# Patient Record
Sex: Female | Born: 2005 | Race: Black or African American | Hispanic: No | Marital: Single | Smoking: Never smoker
Health system: Southern US, Community
[De-identification: ages and names within clinical notes are randomized; demographics above are authoritative.]

---
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
## 2023-05-04 DIAGNOSIS — G44209 Tension-type headache, unspecified, not intractable: Secondary | ICD-10-CM | POA: Diagnosis not present

## 2023-05-04 DIAGNOSIS — F32A Depression, unspecified: Secondary | ICD-10-CM | POA: Diagnosis not present

## 2023-05-04 DIAGNOSIS — Z113 Encounter for screening for infections with a predominantly sexual mode of transmission: Secondary | ICD-10-CM | POA: Diagnosis not present

## 2023-05-04 DIAGNOSIS — Z68.41 Body mass index (BMI) pediatric, greater than or equal to 95th percentile for age: Secondary | ICD-10-CM | POA: Diagnosis not present

## 2023-05-04 DIAGNOSIS — Z23 Encounter for immunization: Secondary | ICD-10-CM | POA: Diagnosis not present

## 2023-05-04 DIAGNOSIS — R000 Tachycardia, unspecified: Secondary | ICD-10-CM | POA: Diagnosis not present

## 2023-05-04 DIAGNOSIS — Z7182 Exercise counseling: Secondary | ICD-10-CM | POA: Diagnosis not present

## 2023-05-04 DIAGNOSIS — Z713 Dietary counseling and surveillance: Secondary | ICD-10-CM | POA: Diagnosis not present

## 2023-05-04 DIAGNOSIS — I341 Nonrheumatic mitral (valve) prolapse: Secondary | ICD-10-CM | POA: Diagnosis not present

## 2023-05-04 DIAGNOSIS — F419 Anxiety disorder, unspecified: Secondary | ICD-10-CM | POA: Diagnosis not present

## 2023-05-04 DIAGNOSIS — Z0001 Encounter for general adult medical examination with abnormal findings: Secondary | ICD-10-CM | POA: Diagnosis not present

## 2023-05-23 DIAGNOSIS — Z713 Dietary counseling and surveillance: Secondary | ICD-10-CM | POA: Diagnosis not present

## 2023-05-23 DIAGNOSIS — Z68.41 Body mass index (BMI) pediatric, greater than or equal to 95th percentile for age: Secondary | ICD-10-CM | POA: Diagnosis not present

## 2023-06-01 DIAGNOSIS — E559 Vitamin D deficiency, unspecified: Secondary | ICD-10-CM | POA: Diagnosis not present

## 2023-06-01 DIAGNOSIS — G44209 Tension-type headache, unspecified, not intractable: Secondary | ICD-10-CM | POA: Diagnosis not present

## 2023-06-01 DIAGNOSIS — Z0000 Encounter for general adult medical examination without abnormal findings: Secondary | ICD-10-CM | POA: Diagnosis not present

## 2023-06-01 DIAGNOSIS — Z713 Dietary counseling and surveillance: Secondary | ICD-10-CM | POA: Diagnosis not present

## 2023-06-01 DIAGNOSIS — F32A Depression, unspecified: Secondary | ICD-10-CM | POA: Diagnosis not present

## 2023-06-01 DIAGNOSIS — Z7182 Exercise counseling: Secondary | ICD-10-CM | POA: Diagnosis not present

## 2023-06-01 DIAGNOSIS — F419 Anxiety disorder, unspecified: Secondary | ICD-10-CM | POA: Diagnosis not present

## 2023-06-03 DIAGNOSIS — R000 Tachycardia, unspecified: Secondary | ICD-10-CM | POA: Diagnosis not present

## 2023-06-03 DIAGNOSIS — I341 Nonrheumatic mitral (valve) prolapse: Secondary | ICD-10-CM | POA: Diagnosis not present

## 2023-06-03 DIAGNOSIS — R079 Chest pain, unspecified: Secondary | ICD-10-CM | POA: Diagnosis not present

## 2023-06-03 DIAGNOSIS — F419 Anxiety disorder, unspecified: Secondary | ICD-10-CM | POA: Diagnosis not present

## 2023-06-04 DIAGNOSIS — I341 Nonrheumatic mitral (valve) prolapse: Secondary | ICD-10-CM | POA: Diagnosis not present

## 2023-06-04 DIAGNOSIS — R000 Tachycardia, unspecified: Secondary | ICD-10-CM | POA: Diagnosis not present

## 2023-06-24 DIAGNOSIS — F419 Anxiety disorder, unspecified: Secondary | ICD-10-CM | POA: Diagnosis not present

## 2023-06-24 DIAGNOSIS — F32A Depression, unspecified: Secondary | ICD-10-CM | POA: Diagnosis not present

## 2023-06-24 DIAGNOSIS — F902 Attention-deficit hyperactivity disorder, combined type: Secondary | ICD-10-CM | POA: Diagnosis not present

## 2023-06-30 DIAGNOSIS — Z7182 Exercise counseling: Secondary | ICD-10-CM | POA: Diagnosis not present

## 2023-06-30 DIAGNOSIS — F419 Anxiety disorder, unspecified: Secondary | ICD-10-CM | POA: Diagnosis not present

## 2023-06-30 DIAGNOSIS — F32A Depression, unspecified: Secondary | ICD-10-CM | POA: Diagnosis not present

## 2023-06-30 DIAGNOSIS — Z713 Dietary counseling and surveillance: Secondary | ICD-10-CM | POA: Diagnosis not present

## 2023-07-19 DIAGNOSIS — F419 Anxiety disorder, unspecified: Secondary | ICD-10-CM | POA: Diagnosis not present

## 2023-07-19 DIAGNOSIS — F32A Depression, unspecified: Secondary | ICD-10-CM | POA: Diagnosis not present

## 2023-07-19 DIAGNOSIS — F902 Attention-deficit hyperactivity disorder, combined type: Secondary | ICD-10-CM | POA: Diagnosis not present

## 2023-08-08 DIAGNOSIS — F32A Depression, unspecified: Secondary | ICD-10-CM | POA: Diagnosis not present

## 2023-08-08 DIAGNOSIS — Z713 Dietary counseling and surveillance: Secondary | ICD-10-CM | POA: Diagnosis not present

## 2023-08-08 DIAGNOSIS — Z7182 Exercise counseling: Secondary | ICD-10-CM | POA: Diagnosis not present

## 2023-10-31 ENCOUNTER — Emergency Department
Admission: EM | Admit: 2023-10-31 | Discharge: 2023-10-31 | Disposition: A | Discharge status: Home / Self Care | Attending: Emergency Medicine | Admitting: Emergency Medicine

## 2023-10-31 ENCOUNTER — Other Ambulatory Visit: Payer: Self-pay

## 2023-10-31 DIAGNOSIS — B9789 Other viral agents as the cause of diseases classified elsewhere: Secondary | ICD-10-CM | POA: Insufficient documentation

## 2023-10-31 DIAGNOSIS — J028 Acute pharyngitis due to other specified organisms: Secondary | ICD-10-CM | POA: Diagnosis not present

## 2023-10-31 DIAGNOSIS — J029 Acute pharyngitis, unspecified: Secondary | ICD-10-CM | POA: Insufficient documentation

## 2023-10-31 LAB — RESP PANEL BY RT-PCR (RSV, FLU A&B, COVID)  RVPGX2
Influenza A by PCR: NEGATIVE
Influenza B by PCR: NEGATIVE
Resp Syncytial Virus by PCR: NEGATIVE
SARS Coronavirus 2 by RT PCR: NEGATIVE

## 2023-10-31 LAB — GROUP A STREP BY PCR: Group A Strep by PCR: NOT DETECTED

## 2023-10-31 NOTE — Discharge Instructions (Addendum)
 You tested negative for strep, RSV, flu and COVID today.  You can take Tylenol and ibuprofen as needed for your sore throat.  Return to the emergency department with worsening symptoms.

## 2023-10-31 NOTE — ED Triage Notes (Signed)
 Pt c/o of sore throat, pt unsure sick contacts. NAD noted. Pt denies fevers or chills.

## 2023-10-31 NOTE — ED Provider Notes (Signed)
 [facility] Provider Note    Event Date/Time   First MD Initiated Contact with Patient 10/31/23 1515     (approximate)   History   Sore Throat   HPI  Caroline Hurst is a 18 y.o. female with no PMH who presents for evaluation of sore throat for 3 days.  Patient thinks she has strep as she had an exposure to it.  Patient states she needs to be evaluated before returning to work.  She does not think she has had a fever.  She also endorses some cough and congestion.      Physical Exam   Triage Vital Signs: ED Triage Vitals  Encounter Vitals Group     BP 10/31/23 1129 (!) 134/98     Systolic BP Percentile --      Diastolic BP Percentile --      Pulse Rate 10/31/23 1129 76     Resp 10/31/23 1129 18     Temp 10/31/23 1129 98.2 F (36.8 C)     Temp Source 10/31/23 1129 Oral     SpO2 10/31/23 1129 100 %     Weight 10/31/23 1127 162 lb 14.7 oz (73.9 kg)     Height 10/31/23 1127 5\' 5"  (1.651 m)     Head Circumference --      Peak Flow --      Pain Score --      Pain Loc --      Pain Education --      Exclude from Growth Chart --     Most recent vital signs: Vitals:   10/31/23 1129  BP: (!) 134/98  Pulse: 76  Resp: 18  Temp: 98.2 F (36.8 C)  SpO2: 100%   General: Awake, no distress.  CV:  Good peripheral perfusion.  RRR. Resp:  Normal effort.  CTAB. Abd:  No distention.  Other:  Oral mucous membranes are moist, tonsils are not enlarged but do have white exudate on them, pharynx is erythematous.   ED Results / Procedures / Treatments   Labs (all labs ordered are listed, but only abnormal results are displayed) Labs Reviewed  GROUP A STREP BY PCR  RESP PANEL BY RT-PCR (RSV, FLU A&B, COVID)  [id number]    PROCEDURES:  Critical Care performed: No  Procedures   MEDICATIONS ORDERED IN ED: Medications - No data to display   IMPRESSION / MDM / ASSESSMENT AND PLAN / ED COURSE  I reviewed the triage vital signs and the nursing  notes.                             18 year old female presents for evaluation of sore throat.  Vital signs are stable patient NAD on exam.  Differential diagnosis includes, but is not limited to, strep pharyngitis, viral pharyngitis, COVID, flu, RSV.  Patient's presentation is most consistent with acute, uncomplicated illness.  Respiratory panel and strep test are both negative.  Suspect this is a viral pharyngitis.  Recommended patient on symptomatic management using Tylenol, ibuprofen, throat sprays, cough drops, etc. she was given a note for work.  Patient voiced understanding, all questions were answered and she was stable at discharge.      FINAL CLINICAL IMPRESSION(S) / ED DIAGNOSES   Final diagnoses:  Viral pharyngitis     Rx / DC Orders   ED Discharge Orders     None        Note:  This  document was prepared using Conservation officer, historic buildings and may include unintentional dictation errors.   Cameron Ali, PA-C 10/31/23 1531    Jene Every, MD 10/31/23 [phone]

## 2023-10-31 NOTE — ED Notes (Signed)
 See triage note  Presents with sore throat    States sxs' started couple of days ago  Afebrile on arrival

## 2023-12-15 DIAGNOSIS — Z713 Dietary counseling and surveillance: Secondary | ICD-10-CM | POA: Diagnosis not present

## 2023-12-15 DIAGNOSIS — Z7182 Exercise counseling: Secondary | ICD-10-CM | POA: Diagnosis not present

## 2023-12-15 DIAGNOSIS — Z789 Other specified health status: Secondary | ICD-10-CM | POA: Diagnosis not present

## 2023-12-15 DIAGNOSIS — F32A Depression, unspecified: Secondary | ICD-10-CM | POA: Diagnosis not present

## 2023-12-15 DIAGNOSIS — F419 Anxiety disorder, unspecified: Secondary | ICD-10-CM | POA: Diagnosis not present

## 2023-12-29 DIAGNOSIS — H5203 Hypermetropia, bilateral: Secondary | ICD-10-CM | POA: Diagnosis not present

## 2023-12-30 DIAGNOSIS — H5213 Myopia, bilateral: Secondary | ICD-10-CM | POA: Diagnosis not present

## 2024-01-25 ENCOUNTER — Telehealth: Payer: Self-pay | Admitting: *Deleted

## 2024-01-25 DIAGNOSIS — Z7689 Persons encountering health services in other specified circumstances: Secondary | ICD-10-CM

## 2024-01-25 NOTE — Progress Notes (Signed)
 Complex Care Management Note  Care Guide Note 01/25/2024 Name: Caroline Hurst MRN: [id number] DOB: 05/29/2006  Caroline Hurst is a 18 y.o. year old female who sees Patient, No Pcp Per for primary care. I reached out to Vinie Greenland by phone today to offer complex care management services.  Ms. Osorno was given information about Complex Care Management services today including:   The Complex Care Management services include support from the care team which includes your Nurse Care Manager, Clinical Social Worker, or Pharmacist.  The Complex Care Management team is here to help remove barriers to the health concerns and goals most important to you. Complex Care Management services are voluntary, and the patient may decline or stop services at any time by request to their care team member.   Complex Care Management Consent Status: Patient did not agree to participate in complex care management services at this time.  Follow up plan:  pt declined to establish with [facility] pcp - declined having pcp at this time - contact info to [facility] given if needed in the future   Encounter Outcome:  Patient Refused  Kandis Ormond, CMA [facility]  [facility], [facility] Guide Direct Dial: [phone]  Fax: [phone] Website: [email or web address]
